# Patient Record
Sex: Male | Born: 1965 | Race: White | Hispanic: No | Marital: Single | Smoking: Former smoker
Health system: Southern US, Community
[De-identification: ages and names within clinical notes are randomized; demographics above are authoritative.]

## PROBLEM LIST (undated history)

## (undated) DIAGNOSIS — E232 Diabetes insipidus: Secondary | ICD-10-CM

## (undated) DIAGNOSIS — K219 Gastro-esophageal reflux disease without esophagitis: Secondary | ICD-10-CM

## (undated) DIAGNOSIS — J302 Other seasonal allergic rhinitis: Secondary | ICD-10-CM

## (undated) HISTORY — PX: CHOLECYSTECTOMY: SHX55

## (undated) HISTORY — PX: HERNIA REPAIR: SHX51

---

## 2014-04-14 ENCOUNTER — Emergency Department (HOSPITAL_BASED_OUTPATIENT_CLINIC_OR_DEPARTMENT_OTHER)
Admission: EM | Admit: 2014-04-14 | Discharge: 2014-04-14 | Disposition: A | Discharge status: Home / Self Care | Payer: Medicaid - Out of State | Attending: Emergency Medicine | Admitting: Emergency Medicine

## 2014-04-14 ENCOUNTER — Emergency Department (HOSPITAL_BASED_OUTPATIENT_CLINIC_OR_DEPARTMENT_OTHER): Payer: Medicaid - Out of State

## 2014-04-14 ENCOUNTER — Encounter (HOSPITAL_BASED_OUTPATIENT_CLINIC_OR_DEPARTMENT_OTHER): Payer: Self-pay | Admitting: Emergency Medicine

## 2014-04-14 DIAGNOSIS — K219 Gastro-esophageal reflux disease without esophagitis: Secondary | ICD-10-CM | POA: Insufficient documentation

## 2014-04-14 DIAGNOSIS — R42 Dizziness and giddiness: Secondary | ICD-10-CM | POA: Diagnosis present

## 2014-04-14 DIAGNOSIS — Z8659 Personal history of other mental and behavioral disorders: Secondary | ICD-10-CM | POA: Insufficient documentation

## 2014-04-14 DIAGNOSIS — Z87891 Personal history of nicotine dependence: Secondary | ICD-10-CM | POA: Insufficient documentation

## 2014-04-14 DIAGNOSIS — Z9109 Other allergy status, other than to drugs and biological substances: Secondary | ICD-10-CM

## 2014-04-14 DIAGNOSIS — Z79899 Other long term (current) drug therapy: Secondary | ICD-10-CM | POA: Diagnosis not present

## 2014-04-14 DIAGNOSIS — J309 Allergic rhinitis, unspecified: Secondary | ICD-10-CM | POA: Diagnosis not present

## 2014-04-14 DIAGNOSIS — IMO0002 Reserved for concepts with insufficient information to code with codable children: Secondary | ICD-10-CM | POA: Insufficient documentation

## 2014-04-14 DIAGNOSIS — E232 Diabetes insipidus: Secondary | ICD-10-CM | POA: Diagnosis not present

## 2014-04-14 HISTORY — DX: Diabetes insipidus: E23.2

## 2014-04-14 HISTORY — DX: Other seasonal allergic rhinitis: J30.2

## 2014-04-14 HISTORY — DX: Gastro-esophageal reflux disease without esophagitis: K21.9

## 2014-04-14 LAB — URINALYSIS, ROUTINE W REFLEX MICROSCOPIC
Bilirubin Urine: NEGATIVE
Glucose, UA: NEGATIVE mg/dL
Hgb urine dipstick: NEGATIVE
KETONES UR: NEGATIVE mg/dL
LEUKOCYTES UA: NEGATIVE
NITRITE: NEGATIVE
PROTEIN: NEGATIVE mg/dL
Specific Gravity, Urine: 1.024 (ref 1.005–1.030)
UROBILINOGEN UA: 1 mg/dL (ref 0.0–1.0)
pH: 6 (ref 5.0–8.0)

## 2014-04-14 LAB — CBC
HCT: 37 % — ABNORMAL LOW (ref 39.0–52.0)
Hemoglobin: 13.8 g/dL (ref 13.0–17.0)
MCH: 32.5 pg (ref 26.0–34.0)
MCHC: 37.3 g/dL — ABNORMAL HIGH (ref 30.0–36.0)
MCV: 87.3 fL (ref 78.0–100.0)
PLATELETS: 170 10*3/uL (ref 150–400)
RBC: 4.24 MIL/uL (ref 4.22–5.81)
RDW: 11.9 % (ref 11.5–15.5)
WBC: 4.1 10*3/uL (ref 4.0–10.5)

## 2014-04-14 LAB — D-DIMER, QUANTITATIVE: D-Dimer, Quant: <0.27 ug/mL-FEU (ref 0.00–0.48)

## 2014-04-14 LAB — COMPREHENSIVE METABOLIC PANEL
ALBUMIN: 3.6 g/dL (ref 3.5–5.2)
ALT: 25 U/L (ref 0–53)
AST: 38 U/L — AB (ref 0–37)
Alkaline Phosphatase: 57 U/L (ref 39–117)
Anion gap: 11 (ref 5–15)
BUN: 9 mg/dL (ref 6–23)
CALCIUM: 9 mg/dL (ref 8.4–10.5)
CO2: 26 mEq/L (ref 19–32)
Chloride: 90 mEq/L — ABNORMAL LOW (ref 96–112)
Creatinine, Ser: 0.8 mg/dL (ref 0.50–1.35)
GFR calc Af Amer: >90 mL/min (ref >90)
GFR calc non Af Amer: >90 mL/min (ref >90)
Glucose, Bld: 93 mg/dL (ref 70–99)
Potassium: 4.6 mEq/L (ref 3.7–5.3)
Sodium: 127 mEq/L — ABNORMAL LOW (ref 137–147)
TOTAL PROTEIN: 6.9 g/dL (ref 6.0–8.3)
Total Bilirubin: 1.5 mg/dL — ABNORMAL HIGH (ref 0.3–1.2)

## 2014-04-14 MED ORDER — FEXOFENADINE HCL 60 MG PO TABS: 60.0000 mg | ORAL_TABLET | Freq: Two times a day (BID) | ORAL | Status: AC

## 2014-04-14 MED ORDER — FLUTICASONE PROPIONATE 50 MCG/ACT NA SUSP: NASAL | Status: AC

## 2014-04-14 NOTE — ED Notes (Signed)
Onset of "lightheadedness" x 2 days, feels unsteady and also reports difficulty breathing intermittently x 2 weeks.

## 2014-04-14 NOTE — ED Provider Notes (Signed)
CSN: [id number]     Arrival date & time 04/14/14  7829 History   First MD Initiated Contact with Patient 04/14/14 [id number]   History provided by patient.   Chief Complaint  Patient presents with  . Dizziness   HPI  Patient reports constellation of symptoms with dizziness, lightheadedness, shortness of breath, and pleuritic chest discomfort for about 1-2 weeks, with worsening over past 2-3 days. States he is most concerned with feeling "dizzy and unsteady", denies any room spinning or vertigo, states that the dizziness is mild but constant, unable to determine what makes it worse, but feels like activity and head position may make it worse. Nothing seems to improve it. Additional complains of shortness of breath with associated "lung discomfort" described as intermittent sharp brief pains across his chest, seems to be worse with activity and deep breathing, but not always. He attributes some shortness of breath to known chronic hx of anxiety (similar hx SOB in past, but now with increased frequency). Denies any chest pain tightness or pressure, headache, vision changes, fevers/chills, cough, recent illness or sick contacts, nausea / vomiting, diarrhea, lower extremity edema / pain, numbness, tingling or weakness. Additional recent history, seen at Urgent Care for similar complaints 1 week ago, given Singulair and Albuterol without relief.  Significant PMH with GAD, Diabetes insipidus, GERD, seasonal allergies, currently visiting from [place]. Not currently taking any medication for anxiety.   Past Medical History  Diagnosis Date  . Diabetes insipidus   . Seasonal allergies   . GERD (gastroesophageal reflux disease)    Past Surgical History  Procedure Laterality Date  . Hernia repair    . Cholecystectomy     No family history on file. History  Substance Use Topics  . Smoking status: Former Games developer  . Smokeless tobacco: Not on file  . Alcohol Use: Yes     Comment: 5-7 drinks per week     Review of Systems  See above HPI  Allergies  Review of patient's allergies indicates no known allergies.  Home Medications   Prior to Admission medications   Medication Sig Start Date End Date Taking? Authorizing Provider  DESMOPRESSIN ACETATE PO Take by mouth.   Yes Historical Provider, MD  loratadine (CLARITIN) 10 MG tablet Take 10 mg by mouth daily.   Yes Historical Provider, MD  omeprazole (PRILOSEC) 20 MG capsule Take 20 mg by mouth daily.   Yes Historical Provider, MD  fexofenadine (ALLEGRA) 60 MG tablet Take 1 tablet (60 mg total) by mouth 2 (two) times daily. 04/14/14   Rolland Porter, MD  fluticasone Aleda Grana) 50 MCG/ACT nasal spray 1 spray each nares bid 04/14/14   Rolland Porter, MD   BP 146/83  Pulse 57  Temp(Src) 97.7 F (36.5 C) (Oral)  Resp 16  Ht  (1.854 m)  Wt 195 lb (88.451 kg)  BMI 25.73 kg/m2  SpO2 100% Physical Exam  Gen - well-appearing, anxious, NAD HEENT - NCAT, PERRL, EOMI without nystagmus, oropharynx clear, MMM Neck - supple, non-tender Heart - RRR, no murmurs heard Chest Wall - Non-tender to palpation across chest wall Lungs - CTAB, no wheezing, crackles, or rhonchi. Normal work of breathing. Abd - soft, NTND, no masses, +active BS Ext - non-tender, no edema, peripheral pulses intact +2 b/l Skin - warm, dry, no rashes Neuro - awake, alert, oriented, grossly non-focal, intact muscle strength 5/5 b/l, intact distal sensation to light touch, gait normal. Cerebellar function intact with b/l finger to nose test. Standing balance test with eyes  closed pt mild backwards drifting  ED Course  Procedures (including critical care time) Labs Review Labs Reviewed  COMPREHENSIVE METABOLIC PANEL - Abnormal; Notable for the following:    Sodium 127 (*)    Chloride 90 (*)    AST 38 (*)    Total Bilirubin 1.5 (*)    All other components within normal limits  CBC - Abnormal; Notable for the following:    HCT 37.0 (*)    MCHC 37.3 (*)    All other  components within normal limits  URINALYSIS, ROUTINE W REFLEX MICROSCOPIC  D-DIMER, QUANTITATIVE    Imaging Review Dg Chest 2 View  04/14/2014   CLINICAL DATA:  Chest tightness for several weeks  EXAM: CHEST  2 VIEW  COMPARISON:  None.  FINDINGS: The heart size and mediastinal contours are within normal limits. Both lungs are clear. The visualized skeletal structures are unremarkable.  IMPRESSION: No active cardiopulmonary disease.   Electronically Signed   By: Elige Ko   On: 04/14/2014 10:41     EKG Interpretation None      MDM   Final diagnoses:  Environmental allergies  Vertigo   29 yr M with PMH GAD, DI, GERD, seasonal allergies presents with constellation of symptoms with SOB, pleuritic chest pain, dizziness/lightheadedness for 1 week worsening over past 2 days. Significant hx of chronic anxiety, with prior hx SOB (now with inc freq), also attributes some symptoms to seasonal allergies (similar to last year), but current episode now worse also with dizziness. Currently well appearing, vitals stable, no tachycardia, SpO2 100%, orthostatic VS negative, significantly anxious on exam and history. Unclear etiology of symptoms, suspect multifactorial with anxiety, allergies, possible metabolic abnormality with DI, no evidence of vertigo or nystagmus. Concern for PE with pleuritic chest pain and SOB, no history to suggest DVT/PE (no travel or stasis, LE symptoms, no prior clot), proceed with work-up to rule out DVT/PE, possible pneumothorax, metabolic work-up, highly unlikely ACS given history and exam.  Ordered EKG, CXR, D-Dimer, CMET, CBC, UA.  UPDATE @ 1105 - Patient with some persistent symptoms, no acute change. Reviewed results with CMET significant for hyponatremia 127 otherwise unremarkable, CBC stable nml WBC 4.1 and nml Hgb 13.8 (no evidence of anemia), EKG (NSR without acute ST-T wave inversions), CXR without acute abnormalities, D-dimer (negative, ruled out PE), UA  negative.  Discharge to home with reassurance given suspicion of high degree of anxiety related to symptoms and possible, changed Claritin to Allegra, start Flonase, advised may try fluid restriction with hyponatremia would continue same desmopressin dose, recommended close follow-up if symptoms worsening.     Saralyn Pilar, DO 04/14/14 1245

## 2014-04-14 NOTE — ED Provider Notes (Signed)
Patient seen and evaluated. He describes a multitude of symptoms. Occasional dizziness. Not orthostatic. Occasional pleuritic pain. Not short of breath. Appears quite anxious but denies anxiety other than "I have subconscious anxiety". Negative chest x-ray. Normal EKG. Negative d-dimer. Not anemic. He does describe dizziness and nasal congestion. We will change him to Allegra and add Flonase and asked him to followup with his primary care physician.  Rolland Porter, MD 04/14/14 1213

## 2014-04-14 NOTE — ED Notes (Signed)
Pt with ?s r/t to rx when in to d/c-MD in to talk with pt

## 2014-04-14 NOTE — Discharge Instructions (Signed)

## 2014-04-18 NOTE — ED Provider Notes (Signed)
I saw and evaluated the patient, reviewed the resident's note and I agree with the findings and plan.   EKG Interpretation   Date/Time:  Tuesday April 14 2014 10:47:46 EDT Ventricular Rate:  60 PR Interval:  218 QRS Duration: 102 QT Interval:  384 QTC Calculation: 384 R Axis:   74 Text Interpretation:  Sinus rhythm with 1st degree A-V block Otherwise  normal ECG Confirmed by Fayrene Fearing  MD, Labria Wos ([id number]) on 04/14/2014 3:00:50 PM      Please see my additional dictation re:  Face to Face encounter.  Rolland Porter, MD 04/18/14 [phone]

## 2014-06-25 ENCOUNTER — Emergency Department (HOSPITAL_BASED_OUTPATIENT_CLINIC_OR_DEPARTMENT_OTHER)
Admission: EM | Admit: 2014-06-25 | Discharge: 2014-06-25 | Disposition: A | Discharge status: Home / Self Care | Payer: No Typology Code available for payment source | Attending: Emergency Medicine | Admitting: Emergency Medicine

## 2014-06-25 ENCOUNTER — Encounter (HOSPITAL_BASED_OUTPATIENT_CLINIC_OR_DEPARTMENT_OTHER): Payer: Self-pay | Admitting: *Deleted

## 2014-06-25 DIAGNOSIS — Z7951 Long term (current) use of inhaled steroids: Secondary | ICD-10-CM | POA: Diagnosis not present

## 2014-06-25 DIAGNOSIS — Y9389 Activity, other specified: Secondary | ICD-10-CM | POA: Insufficient documentation

## 2014-06-25 DIAGNOSIS — Y92410 Unspecified street and highway as the place of occurrence of the external cause: Secondary | ICD-10-CM | POA: Diagnosis not present

## 2014-06-25 DIAGNOSIS — Z79899 Other long term (current) drug therapy: Secondary | ICD-10-CM | POA: Insufficient documentation

## 2014-06-25 DIAGNOSIS — S199XXA Unspecified injury of neck, initial encounter: Secondary | ICD-10-CM | POA: Insufficient documentation

## 2014-06-25 DIAGNOSIS — K219 Gastro-esophageal reflux disease without esophagitis: Secondary | ICD-10-CM | POA: Insufficient documentation

## 2014-06-25 DIAGNOSIS — S39012A Strain of muscle, fascia and tendon of lower back, initial encounter: Secondary | ICD-10-CM | POA: Diagnosis not present

## 2014-06-25 DIAGNOSIS — Z87891 Personal history of nicotine dependence: Secondary | ICD-10-CM | POA: Diagnosis not present

## 2014-06-25 DIAGNOSIS — S3992XA Unspecified injury of lower back, initial encounter: Secondary | ICD-10-CM | POA: Diagnosis present

## 2014-06-25 MED ORDER — OXYCODONE-ACETAMINOPHEN 5-325 MG PO TABS: 1.0000 | ORAL_TABLET | Freq: Four times a day (QID) | ORAL | Status: AC | PRN

## 2014-06-25 MED ORDER — CYCLOBENZAPRINE HCL 10 MG PO TABS: 10.0000 mg | ORAL_TABLET | Freq: Two times a day (BID) | ORAL | Status: AC | PRN

## 2014-06-25 NOTE — Discharge Instructions (Signed)
Back Pain, Adult Low back pain is very common. About 1 in 5 people have back pain.The cause of low back pain is rarely dangerous. The pain often gets better over time.About half of people with a sudden onset of back pain feel better in just 2 weeks. About 8 in 10 people feel better by 6 weeks.  CAUSES Some common causes of back pain include:  Strain of the muscles or ligaments supporting the spine.  Wear and tear (degeneration) of the spinal discs.  Arthritis.  Direct injury to the back. DIAGNOSIS Most of the time, the direct cause of low back pain is not known.However, back pain can be treated effectively even when the exact cause of the pain is unknown.Answering your caregiver's questions about your overall health and symptoms is one of the most accurate ways to make sure the cause of your pain is not dangerous. If your caregiver needs more information, he or she may order lab work or imaging tests (X-rays or MRIs).However, even if imaging tests show changes in your back, this usually does not require surgery. HOME CARE INSTRUCTIONS For many people, back pain returns.Since low back pain is rarely dangerous, it is often a condition that people can learn to manageon their own.   Remain active. It is stressful on the back to sit or stand in one place. Do not sit, drive, or stand in one place for more than 30 minutes at a time. Take short walks on level surfaces as soon as pain allows.Try to increase the length of time you walk each day.  Do not stay in bed.Resting more than 1 or 2 days can delay your recovery.  Do not avoid exercise or work.Your body is made to move.It is not dangerous to be active, even though your back may hurt.Your back will likely heal faster if you return to being active before your pain is gone.  Pay attention to your body when you bend and lift. Many people have less discomfortwhen lifting if they bend their knees, keep the load close to their bodies,and  avoid twisting. Often, the most comfortable positions are those that put less stress on your recovering back.  Find a comfortable position to sleep. Use a firm mattress and lie on your side with your knees slightly bent. If you lie on your back, put a pillow under your knees.  Only take over-the-counter or prescription medicines as directed by your caregiver. Over-the-counter medicines to reduce pain and inflammation are often the most helpful.Your caregiver may prescribe muscle relaxant drugs.These medicines help dull your pain so you can more quickly return to your normal activities and healthy exercise.  Put ice on the injured area.  Put ice in a plastic bag.  Place a towel between your skin and the bag.  Leave the ice on for 15-20 minutes, 03-04 times a day for the first 2 to 3 days. After that, ice and heat may be alternated to reduce pain and spasms.  Ask your caregiver about trying back exercises and gentle massage. This may be of some benefit.  Avoid feeling anxious or stressed.Stress increases muscle tension and can worsen back pain.It is important to recognize when you are anxious or stressed and learn ways to manage it.Exercise is a great option. SEEK MEDICAL CARE IF:  You have pain that is not relieved with rest or medicine.  You have pain that does not improve in 1 week.  You have new symptoms.  You are generally not feeling well. SEEK   IMMEDIATE MEDICAL CARE IF:   You have pain that radiates from your back into your legs.  You develop new bowel or bladder control problems.  You have unusual weakness or numbness in your arms or legs.  You develop nausea or vomiting.  You develop abdominal pain.  You feel faint. Document Released: 08/07/2005 Document Revised: 02/06/2012 Document Reviewed: 12/09/2013 Exit[employer] Patient Information 2015 [employer]. This information is not intended to replace advice given to you by your health care provider. Make sure you  discuss any questions you have with your health care provider.  
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
## 2014-07-31 ENCOUNTER — Encounter (HOSPITAL_BASED_OUTPATIENT_CLINIC_OR_DEPARTMENT_OTHER): Payer: Self-pay

## 2014-07-31 ENCOUNTER — Emergency Department (HOSPITAL_BASED_OUTPATIENT_CLINIC_OR_DEPARTMENT_OTHER)
Admission: EM | Admit: 2014-07-31 | Discharge: 2014-07-31 | Disposition: A | Discharge status: Home / Self Care | Payer: 59 | Attending: Emergency Medicine | Admitting: Emergency Medicine

## 2014-07-31 DIAGNOSIS — K029 Dental caries, unspecified: Secondary | ICD-10-CM | POA: Diagnosis not present

## 2014-07-31 DIAGNOSIS — Z79899 Other long term (current) drug therapy: Secondary | ICD-10-CM | POA: Diagnosis not present

## 2014-07-31 DIAGNOSIS — E232 Diabetes insipidus: Secondary | ICD-10-CM | POA: Insufficient documentation

## 2014-07-31 DIAGNOSIS — R590 Localized enlarged lymph nodes: Secondary | ICD-10-CM | POA: Diagnosis present

## 2014-07-31 DIAGNOSIS — Z8709 Personal history of other diseases of the respiratory system: Secondary | ICD-10-CM | POA: Insufficient documentation

## 2014-07-31 DIAGNOSIS — Z87891 Personal history of nicotine dependence: Secondary | ICD-10-CM | POA: Insufficient documentation

## 2014-07-31 DIAGNOSIS — I889 Nonspecific lymphadenitis, unspecified: Secondary | ICD-10-CM | POA: Insufficient documentation

## 2014-07-31 DIAGNOSIS — K219 Gastro-esophageal reflux disease without esophagitis: Secondary | ICD-10-CM | POA: Diagnosis not present

## 2014-07-31 MED ORDER — CEPHALEXIN 500 MG PO CAPS
500.0000 mg | ORAL_CAPSULE | Freq: Four times a day (QID) | ORAL | Status: AC
Start: 2014-07-31 — End: ?

## 2014-07-31 MED ORDER — HYDROCODONE-ACETAMINOPHEN 5-325 MG PO TABS: 1.0000 | ORAL_TABLET | ORAL | Status: AC | PRN

## 2014-07-31 NOTE — ED Notes (Signed)
States "lymph nodes" in neck have been swollen and intermittently painful x 1 week.  Symptoms worsened today.  Painful to swallow, increased swelling, denies fever, and otherwise feels normal.

## 2014-07-31 NOTE — ED Provider Notes (Signed)
CSN: [id number]     Arrival date & time 07/31/14  [id number] History   First MD Initiated Contact with Patient 07/31/14 1000     Chief Complaint  Patient presents with  . Lymphadenopathy     (Consider location/radiation/quality/duration/timing/severity/associated sxs/prior Treatment) HPI Complains of painful swollen lymph nodes in his neck which started one week ago. Develop pain with swallowing starting this morning. No fever. No other associated symptoms no cough. Treated with ibuprofen prior to coming here with partial relief. No other associated symptoms. Pain is worse with swallowing improved with anything. No vomiting no dyspnea. Past Medical History  Diagnosis Date  . Diabetes insipidus   . Seasonal allergies   . GERD (gastroesophageal reflux disease)    Past Surgical History  Procedure Laterality Date  . Hernia repair    . Cholecystectomy     No family history on file. History  Substance Use Topics  . Smoking status: Former Games developermoker  . Smokeless tobacco: Not on file  . Alcohol Use: Yes     Comment: 5-7 drinks per week    Review of Systems  Constitutional: Negative.   HENT: Positive for dental problem, sore throat and trouble swallowing. Negative for voice change.        Dental caries  Respiratory: Negative.   Cardiovascular: Negative.   Gastrointestinal: Negative.   Musculoskeletal: Negative.   Skin: Negative.   Neurological: Negative.   Psychiatric/Behavioral: Negative.   All other systems reviewed and are negative.     Allergies  Review of patient's allergies indicates no known allergies.  Home Medications   Prior to Admission medications   Medication Sig Start Date End Date Taking? Authorizing Provider  cyclobenzaprine (FLEXERIL) 10 MG tablet Take 1 tablet (10 mg total) by mouth 2 (two) times daily as needed for muscle spasms. 06/25/14   Purvis SheffieldForrest Harrison, MD  DESMOPRESSIN ACETATE PO Take by mouth.    Historical Provider, MD  fexofenadine (ALLEGRA) 60 MG  tablet Take 1 tablet (60 mg total) by mouth 2 (two) times daily. 04/14/14   Rolland PorterMark James, MD  fluticasone Aleda Grana(FLONASE) 50 MCG/ACT nasal spray 1 spray each nares bid 04/14/14   Rolland PorterMark James, MD  loratadine (CLARITIN) 10 MG tablet Take 10 mg by mouth daily.    Historical Provider, MD  omeprazole (PRILOSEC) 20 MG capsule Take 20 mg by mouth daily.    Historical Provider, MD  oxyCODONE-acetaminophen (PERCOCET) 5-325 MG per tablet Take 1 tablet by mouth every 6 (six) hours as needed. 06/25/14   Purvis SheffieldForrest Harrison, MD   BP 143/86 mmHg  Pulse 68  Temp(Src) 97.6 F (36.4 C)  Resp 14  Ht 6\' 1"  (1.854 m)  Wt 195 lb (88.451 kg)  BMI 25.73 kg/m2  SpO2 100% Physical Exam  Constitutional: He appears well-developed and well-nourished. No distress.  HENT:  Head: Normocephalic and atraumatic.  Right Ear: External ear normal.  Left Ear: External ear normal.  Mouth/Throat: No oropharyngeal exudate.  Oropharynx mildly reddened. Uvula midline. No exudate. Bilateral tympanic membranes normal. Elling secretions well. No distress  Eyes: Conjunctivae are normal. Pupils are equal, round, and reactive to light.  Neck: Neck supple. No tracheal deviation present. No thyromegaly present.  Mildly tender anterior cervical nodes  Cardiovascular: Normal rate and regular rhythm.   No murmur heard. Pulmonary/Chest: Effort normal and breath sounds normal.  Abdominal: Soft. Bowel sounds are normal. He exhibits no distension. There is no tenderness.  Musculoskeletal: Normal range of motion. He exhibits no edema or tenderness.  Lymphadenopathy:  He has cervical adenopathy.  Neurological: He is alert. Coordination normal.  Skin: Skin is warm and dry. No rash noted.  Psychiatric: He has a normal mood and affect.  Nursing note and vitals reviewed.   ED Course  Procedures (including critical care time) Labs Review Labs Reviewed - No data to display  Imaging Review No results found.   EKG Interpretation None      MDM   Suspect pharyngitis with cervical lymphadenitis plan prescription Keflex, Norco. Follow-up PMD if not improving by next week Final diagnoses:  None        Doug SouSam Leandrea Ackley, MD 07/31/14 1025

## 2014-07-31 NOTE — ED Notes (Signed)
MD at bedside. 

## 2014-07-31 NOTE — Discharge Instructions (Signed)
Lymphadenopathy Take Tylenol or Advil for mild pain or the pain medicine prescribed for bad pain. Complete the antibiotic prescribed. See your physician if not improving in 4 or 5 days. Return if your condition worsens for any reason. Lymphadenopathy means "disease of the lymph glands." But the term is usually used to describe swollen or enlarged lymph glands, also called lymph nodes. These are the bean-shaped organs found in many locations including the neck, underarm, and groin. Lymph glands are part of the immune system, which fights infections in your body. Lymphadenopathy can occur in just one area of the body, such as the neck, or it can be generalized, with lymph node enlargement in several areas. The nodes found in the neck are the most common sites of lymphadenopathy. CAUSES When your immune system responds to germs (such as viruses or bacteria ), infection-fighting cells and fluid build up. This causes the glands to grow in size. Usually, this is not something to worry about. Sometimes, the glands themselves can become infected and inflamed. This is called lymphadenitis. Enlarged lymph nodes can be caused by many diseases:  Bacterial disease, such as strep throat or a skin infection.  Viral disease, such as a common cold.  Other germs, such as Lyme disease, tuberculosis, or sexually transmitted diseases.  Cancers, such as lymphoma (cancer of the lymphatic system) or leukemia (cancer of the white blood cells).  Inflammatory diseases such as lupus or rheumatoid arthritis.  Reactions to medications. Many of the diseases above are rare, but important. This is why you should see your caregiver if you have lymphadenopathy. SYMPTOMS  Swollen, enlarged lumps in the neck, back of the head, or other locations.  Tenderness.  Warmth or redness of the skin over the lymph nodes.  Fever. DIAGNOSIS Enlarged lymph nodes are often near the source of infection. They can help health care providers  diagnose your illness. For instance:  Swollen lymph nodes around the jaw might be caused by an infection in the mouth.  Enlarged glands in the neck often signal a throat infection.  Lymph nodes that are swollen in more than one area often indicate an illness caused by a virus. Your caregiver will likely know what is causing your lymphadenopathy after listening to your history and examining you. Blood tests, x-rays, or other tests may be needed. If the cause of the enlarged lymph node cannot be found, and it does not go away by itself, then a biopsy may be needed. Your caregiver will discuss this with you. TREATMENT Treatment for your enlarged lymph nodes will depend on the cause. Many times the nodes will shrink to normal size by themselves, with no treatment. Antibiotics or other medicines may be needed for infection. Only take over-the-counter or prescription medicines for pain, discomfort, or fever as directed by your caregiver. HOME CARE INSTRUCTIONS Swollen lymph glands usually return to normal when the underlying medical condition goes away. If they persist, contact your health-care provider. He/she might prescribe antibiotics or other treatments, depending on the diagnosis. Take any medications exactly as prescribed. Keep any follow-up appointments made to check on the condition of your enlarged nodes. SEEK MEDICAL CARE IF:  Swelling lasts for more than two weeks.  You have symptoms such as weight loss, night sweats, fatigue, or fever that does not go away.  The lymph nodes are hard, seem fixed to the skin, or are growing rapidly.  Skin over the lymph nodes is red and inflamed. This could mean there is an infection. SEEK IMMEDIATE  MEDICAL CARE IF:  Fluid starts leaking from the area of the enlarged lymph node.  You develop a fever of 102 F (38.9 C) or greater.  Severe pain develops (not necessarily at the site of a large lymph node).  You develop chest pain or shortness of  breath.  You develop worsening abdominal pain. MAKE SURE YOU:  Understand these instructions.  Will watch your condition.  Will get help right away if you are not doing well or get worse. Document Released: 05/16/2008 Document Revised: 12/22/2013 Document Reviewed: 05/16/2008 [facility] Patient Information 2015 [place] [employer], [place]. This information is not intended to replace advice given to you by your health care provider. Make sure you discuss any questions you have with your health care provider.

## 2015-08-30 IMAGING — CR DG CHEST 2V
2 series · 2 of 2 positions shown · non-contrast
Comparison: None.

CLINICAL DATA: Chest tightness for several weeks

EXAM:
CHEST  2 VIEW

[w chest pa]
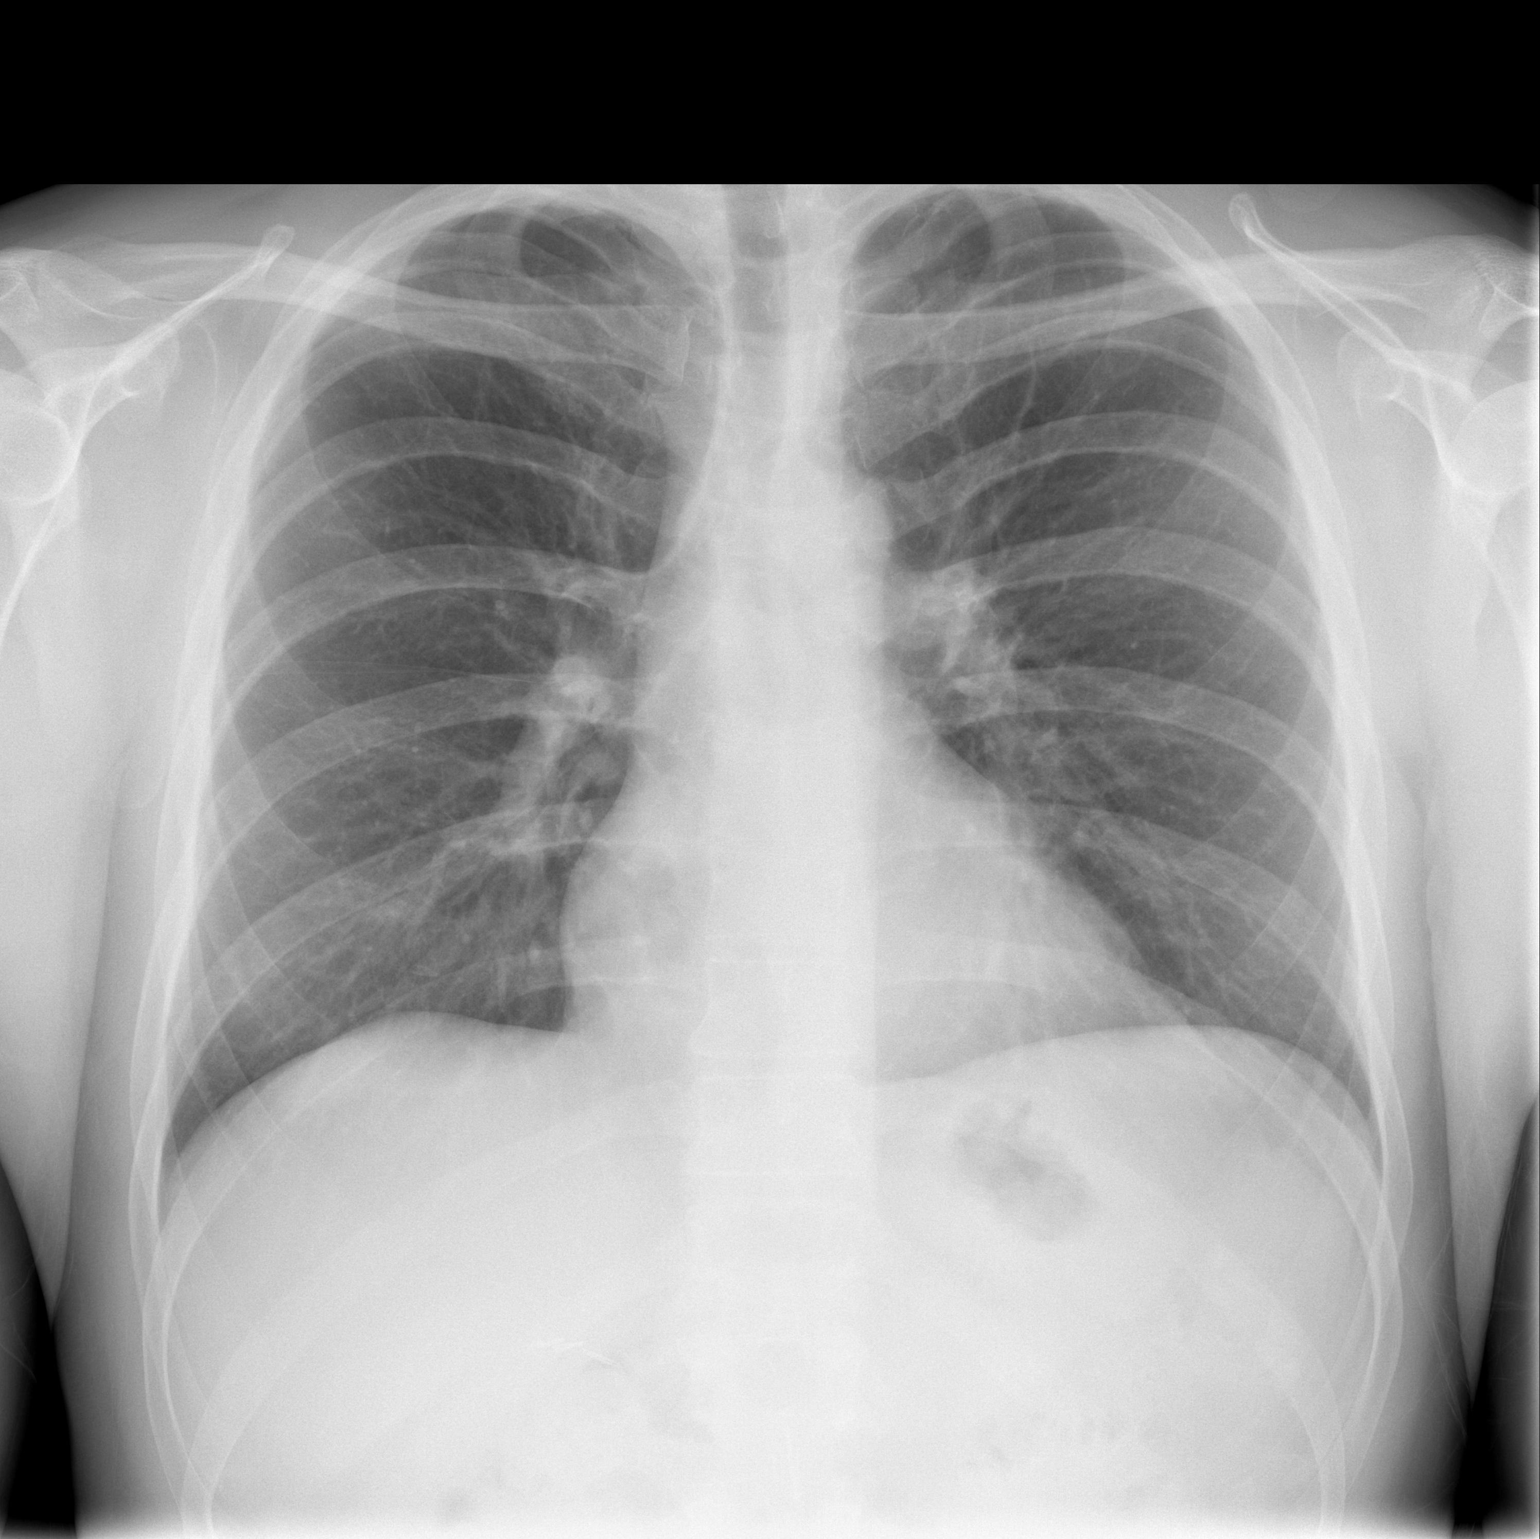

[w chest lat]
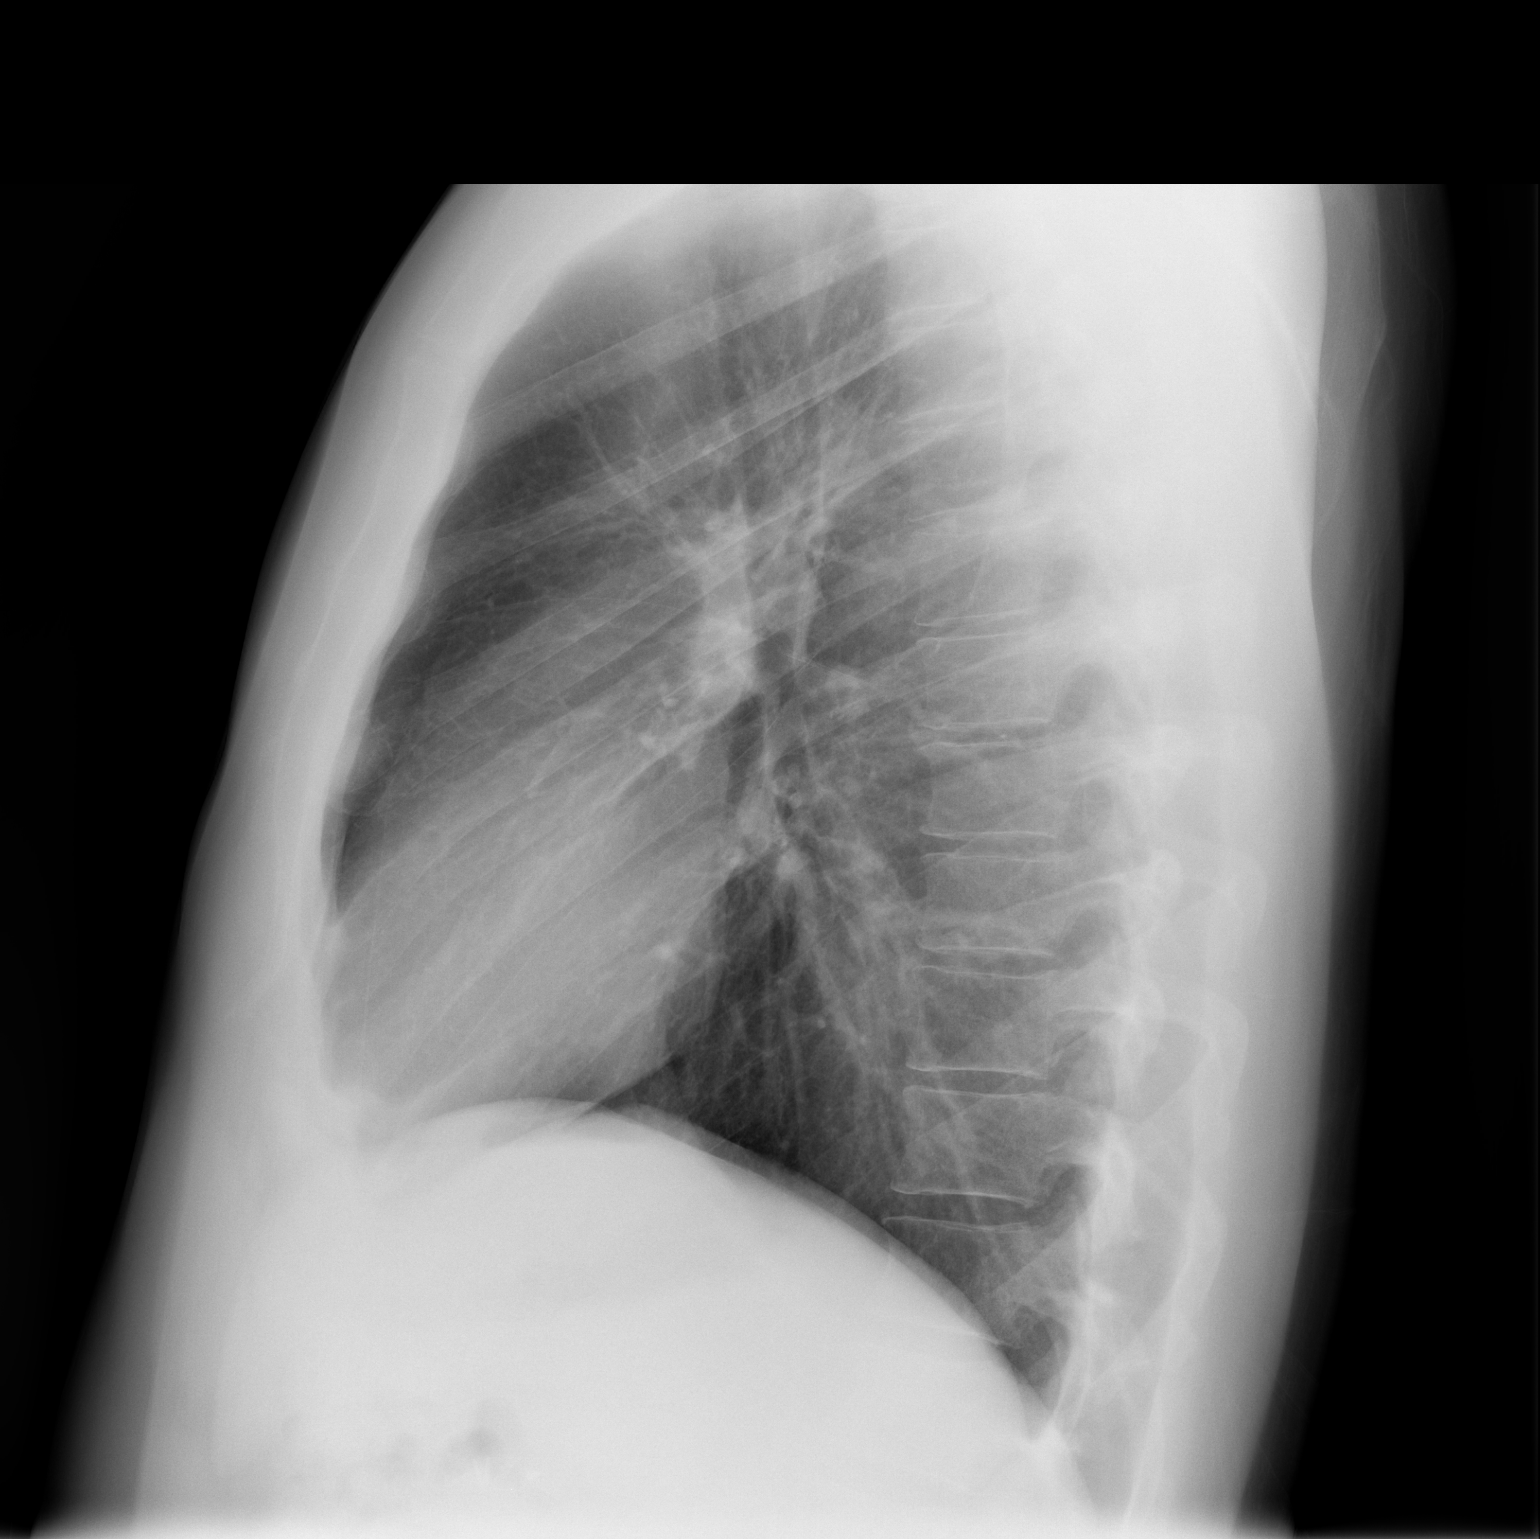

[2 of 2 positions shown; findings below may reference images not displayed]

FINDINGS: The heart size and mediastinal contours are within normal limits.
Both lungs are clear. The visualized skeletal structures are
unremarkable.
IMPRESSION: No active cardiopulmonary disease.
